# Patient Record
Sex: Male | Born: 2000 | Race: Black or African American | Hispanic: No | Marital: Single | Smoking: Never smoker
Health system: Southern US, Community
[De-identification: ages and names within clinical notes are randomized; demographics above are authoritative.]

---
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
## 2020-04-12 ENCOUNTER — Telehealth: Payer: Self-pay

## 2020-04-12 NOTE — Telephone Encounter (Signed)
Patient will need TB test and flu vaccine to complete college forms. Called patient to schedule, no answer and VM not set up yet.  Forms remain in blue pod RN folder.

## 2020-04-12 NOTE — Telephone Encounter (Signed)
Please call pt, Luis Solis at [phone] once his school immunization forms have been filled out and are ready to be picked up. Thank you!

## 2020-04-13 ENCOUNTER — Ambulatory Visit (INDEPENDENT_AMBULATORY_CARE_PROVIDER_SITE_OTHER): Payer: Medicaid Other | Admitting: *Deleted

## 2020-04-13 ENCOUNTER — Other Ambulatory Visit: Payer: Self-pay

## 2020-04-13 DIAGNOSIS — Z111 Encounter for screening for respiratory tuberculosis: Secondary | ICD-10-CM

## 2020-04-13 DIAGNOSIS — Z23 Encounter for immunization: Secondary | ICD-10-CM

## 2020-04-13 NOTE — Progress Notes (Signed)
Patient came in for labs for Quantiferon and also received the flu vaccine; both are needed to school. Successful collection.

## 2020-04-13 NOTE — Telephone Encounter (Signed)
Pt scheduled for lab and flu vaccine today.

## 2020-04-15 LAB — QUANTIFERON-TB GOLD PLUS
Mitogen-NIL: >10 IU/mL
NIL: 0.03 IU/mL
QuantiFERON-TB Gold Plus: NEGATIVE
TB1-NIL: 0 IU/mL
TB2-NIL: 0.01 IU/mL

## 2020-04-15 NOTE — Telephone Encounter (Signed)
Lab resulted. Form completed and signed by Dr. Florestine Avers. patient came in and picked up the forms. Copy made for scanning.

## 2020-04-15 NOTE — Telephone Encounter (Signed)
TB blood test result is pending.
